# Patient Record
Sex: Female | Born: 1965
Health system: Southern US, Community
[De-identification: ages and names within clinical notes are randomized; demographics above are authoritative.]

## PROBLEM LIST (undated history)

## (undated) DIAGNOSIS — G43909 Migraine, unspecified, not intractable, without status migrainosus: Secondary | ICD-10-CM

## (undated) DIAGNOSIS — E559 Vitamin D deficiency, unspecified: Secondary | ICD-10-CM

## (undated) DIAGNOSIS — T7840XA Allergy, unspecified, initial encounter: Secondary | ICD-10-CM

## (undated) HISTORY — PX: BREAST SURGERY: SHX581

## (undated) HISTORY — DX: Allergy, unspecified, initial encounter: T78.40XA

## (undated) HISTORY — PX: DILATION AND CURETTAGE OF UTERUS: SHX78

## (undated) HISTORY — PX: COLONOSCOPY: SHX174

## (undated) HISTORY — PX: VARICOSE VEIN SURGERY: SHX832

## (undated) HISTORY — PX: WISDOM TOOTH EXTRACTION: SHX21

---

## 1998-04-06 ENCOUNTER — Other Ambulatory Visit: Admission: RE | Admit: 1998-04-06 | Discharge: 1998-04-06 | Payer: Self-pay | Admitting: Obstetrics and Gynecology

## 1999-03-26 ENCOUNTER — Other Ambulatory Visit: Admission: RE | Admit: 1999-03-26 | Discharge: 1999-03-26 | Payer: Self-pay | Admitting: Obstetrics and Gynecology

## 1999-04-14 ENCOUNTER — Inpatient Hospital Stay (HOSPITAL_COMMUNITY): Admission: AD | Admit: 1999-04-14 | Discharge: 1999-04-14 | Payer: Self-pay | Admitting: Obstetrics and Gynecology

## 1999-10-22 ENCOUNTER — Encounter (INDEPENDENT_AMBULATORY_CARE_PROVIDER_SITE_OTHER): Payer: Self-pay

## 1999-10-22 ENCOUNTER — Inpatient Hospital Stay (HOSPITAL_COMMUNITY): Admission: AD | Admit: 1999-10-22 | Discharge: 1999-10-26 | Payer: Self-pay | Admitting: Obstetrics and Gynecology

## 1999-10-27 ENCOUNTER — Encounter: Admission: RE | Admit: 1999-10-27 | Discharge: 2000-01-25 | Payer: Self-pay | Admitting: Obstetrics and Gynecology

## 1999-12-03 ENCOUNTER — Other Ambulatory Visit: Admission: RE | Admit: 1999-12-03 | Discharge: 1999-12-03 | Payer: Self-pay | Admitting: Obstetrics and Gynecology

## 2000-01-27 ENCOUNTER — Encounter: Admission: RE | Admit: 2000-01-27 | Discharge: 2000-02-15 | Payer: Self-pay | Admitting: Obstetrics and Gynecology

## 2000-12-04 ENCOUNTER — Other Ambulatory Visit: Admission: RE | Admit: 2000-12-04 | Discharge: 2000-12-04 | Payer: Self-pay | Admitting: Obstetrics and Gynecology

## 2001-12-17 ENCOUNTER — Other Ambulatory Visit: Admission: RE | Admit: 2001-12-17 | Discharge: 2001-12-17 | Payer: Self-pay | Admitting: Obstetrics and Gynecology

## 2007-07-01 ENCOUNTER — Encounter: Admission: RE | Admit: 2007-07-01 | Discharge: 2007-07-01 | Payer: Self-pay | Admitting: Obstetrics and Gynecology

## 2007-07-01 IMAGING — US US EXTREM LOW VENOUS BILAT
1 series · 13 of 24 positions shown · non-contrast
Comparison: none

CLINICAL DATA: Varicose veins of right lower extremity.  The patient is being evaluated for possible varicose vein treatment. 
RIGHT LOWER EXTREMITY VENOUS DOPPLER ULTRASOUND:
TECHNIQUE: A full lower extremity venous evaluation was performed with evaluation of the deep veins in a supine position followed by an upright superficial venous insufficiency exam.

[Series 1: us extrem low venous bilat · 13 of 35 slices shown]
[im 1/35]
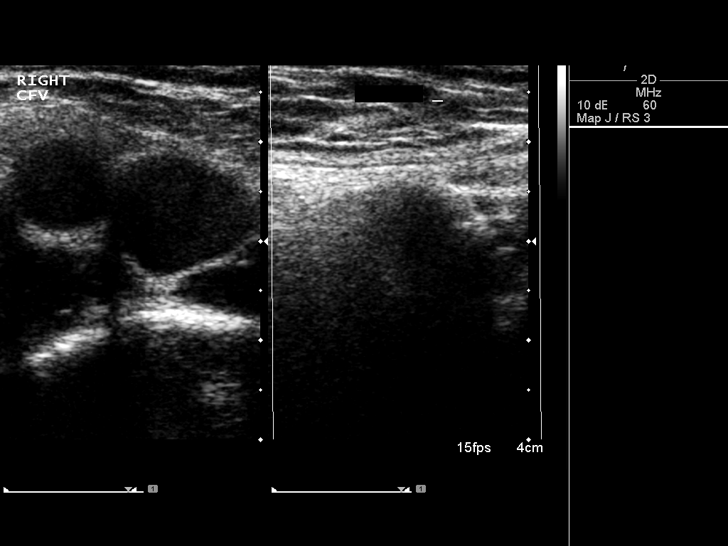
[im 3/35]
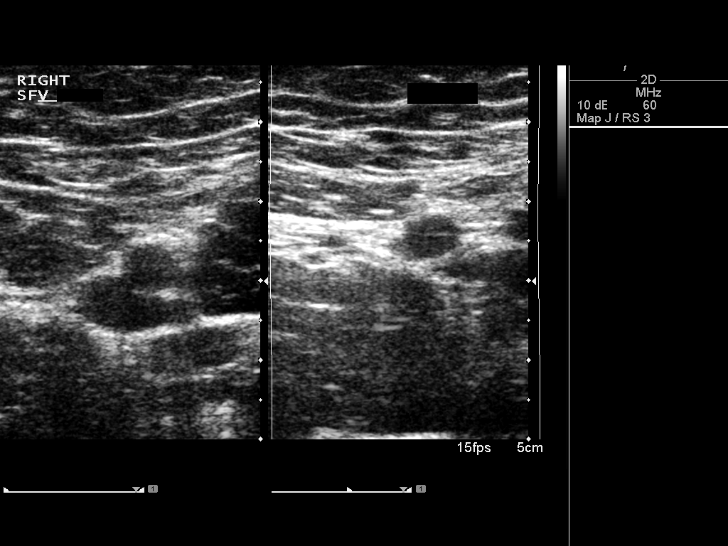
[im 6/35]
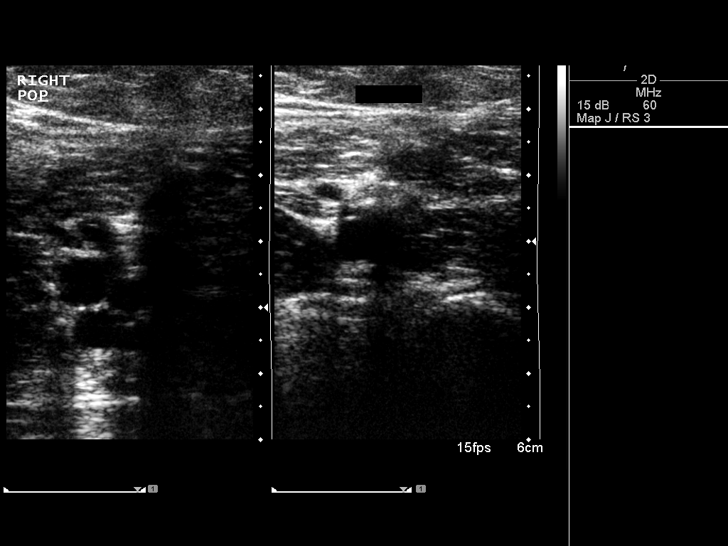
[im 9/35]
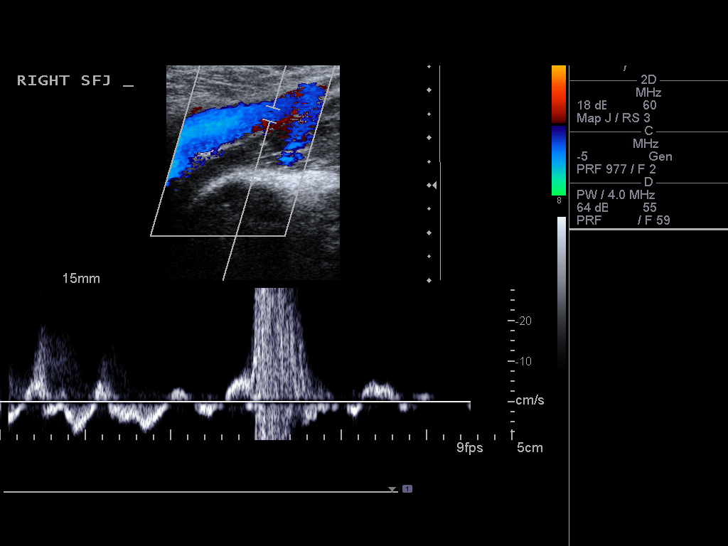
[im 12/35]
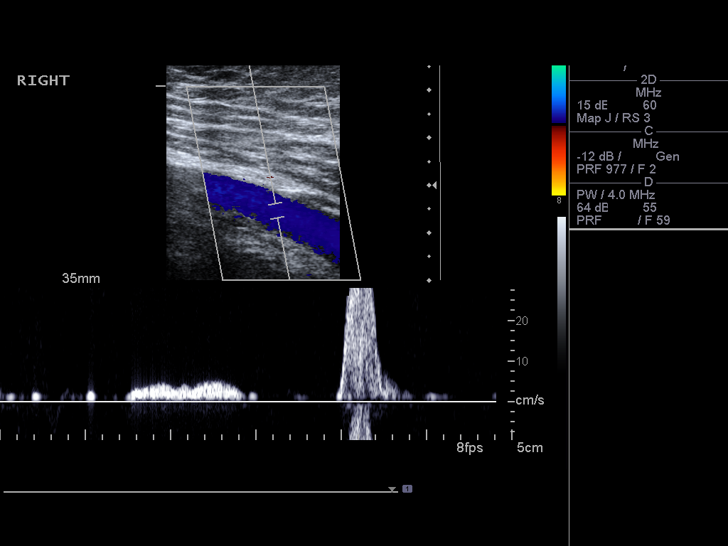
[im 15/35]
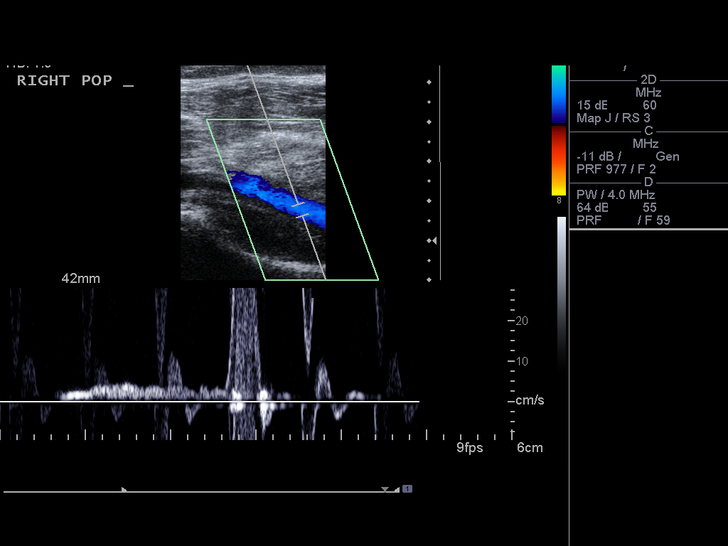
[im 18/35]
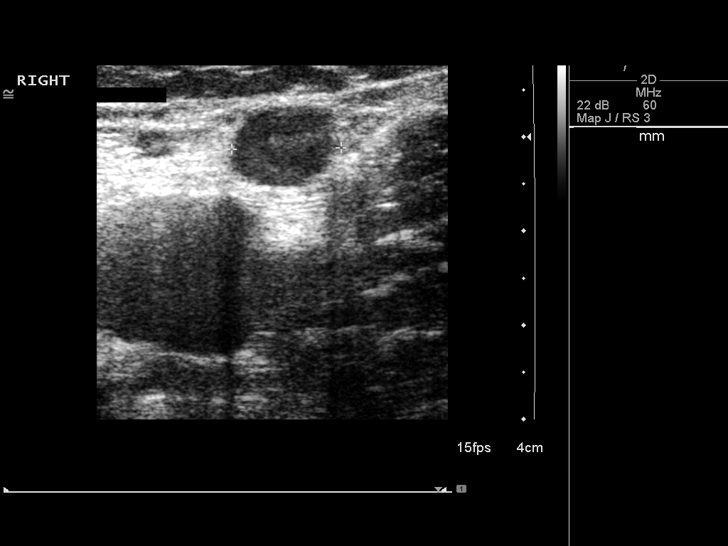
[im 20/35]
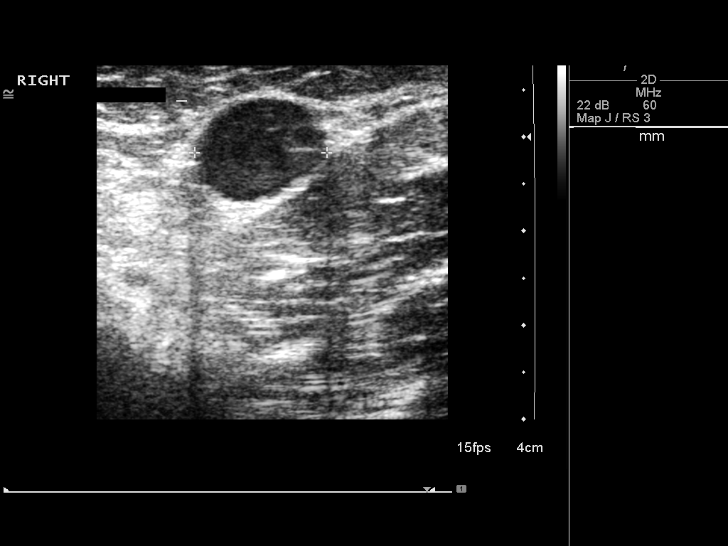
[im 23/35]
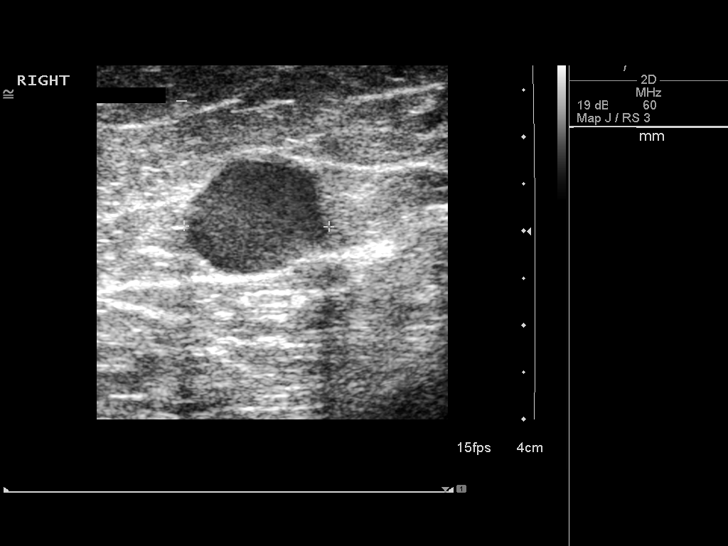
[im 26/35]
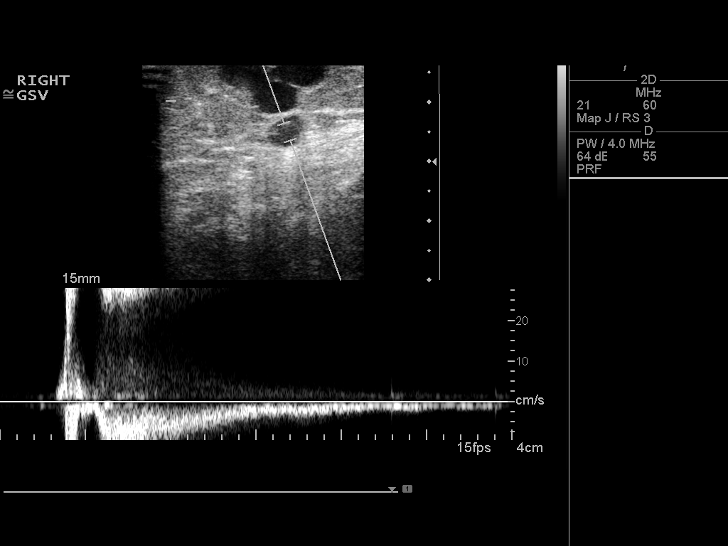
[im 29/35]
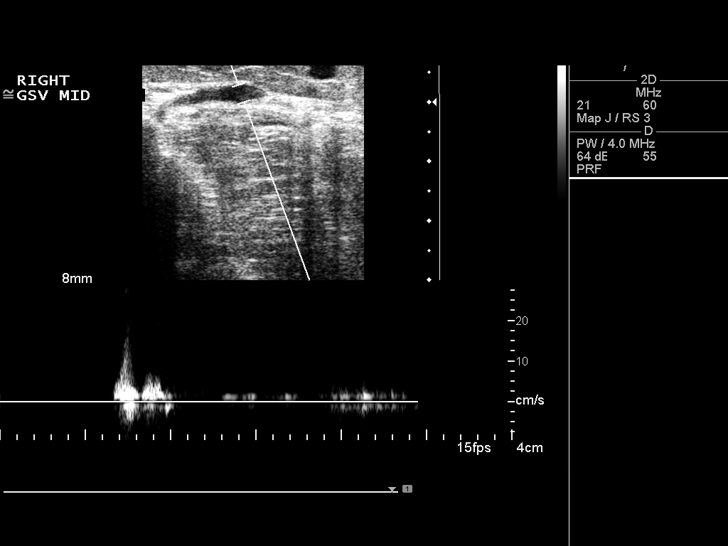
[im 32/35]
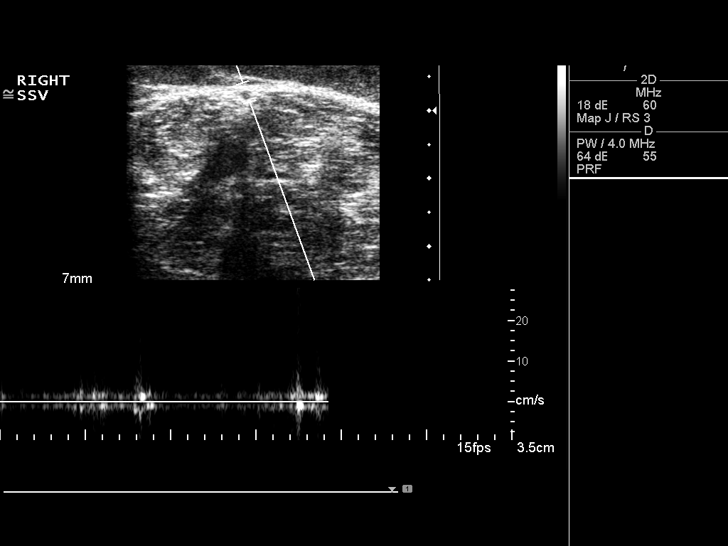
[im 35/35]
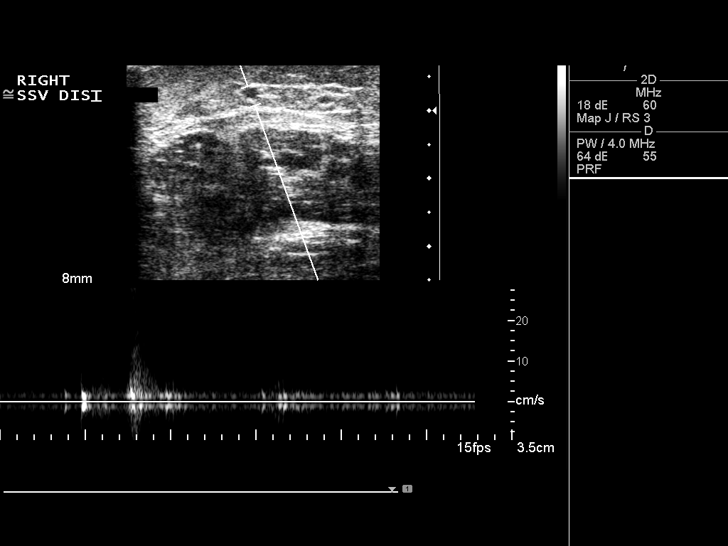

[13 of 24 positions shown; findings below may reference images not displayed]

FINDINGS: Deep venous evaluation shows a normal deep venous system in the right lower extremity with no evidence of thrombosis, deep venous reflux or stigmata of prior thrombosis.  All of the deep veins show normal compressibility, augmentation and phasicity.  
Upright insufficiency exam demonstrates diffuse and severe venous incompetence and insufficiency of nearly the entire right great saphenous vein.  Reflux is demonstrated over a prolonged time with augmentation beginning at the saphenofemoral junction and continuing throughout the thigh.  There are segments of aneurysmal dilatation of the great saphenous vein in the thigh.  In the proximal thigh, dilated segments of the vein just below the saphenofemoral junction measured between 11 and 14 mm in diameter.  In the distal thigh, a focal aneurysmal segment measures 15 mm in diameter.  
A large varicose truck communication is demonstrated off of the posteromedial aspect of the great saphenous vein at the knee.  This communicates with a network of dilated varicosities of the proximal calf.  Reflux is demonstrated below the varicose trunk to roughly the level of the mid calf.  A perforator is demonstrated in the mid calf from the great saphenous vein to the deep venous system.  No venous insufficiency is demonstrated in the distal calf.  
Short saphenous evaluation shows a tiny caliber short saphenous vein in the distal thigh and knee.  The vein does communicate with varicosities in the mid calf along the medial aspect of the calf.  The short saphenous vein does not demonstrate significant reflux or insufficiency.
IMPRESSION: Significant venous insufficiency of the right great saphenous vein with demonstrated communication to a network of dilated varicosities of the proximal calf.  Prolonged reflux is seen throughout the course of the vein extending from the saphenofemoral junction to roughly the mid calf.  The very distal calf segment does not show significant reflux.   Aneurysmal segments of the great saphenous vein are present in the thigh.  A mid calf perforator is also present.   The short saphenous vein does communicate via one trunk in the mid calf with calf varicosities.  However, no significant venous insufficiency is detected in the short saphenous vein.  The deep venous system of the right lower extremity is normal.

## 2010-12-21 NOTE — Discharge Summary (Signed)
[facility]  Patient:    Yvonne Morales, Yvonne Morales                     MRN: [id number] Adm. Date:  [id number] Disc. Date: [id number] Attending:  Minette Headland Dictator:   Danie Chandler, R.N.                           Discharge Summary  ADMISSION DIAGNOSES:          1. Intrauterine pregnancy at term.                               2. For induction of labor.  DISCHARGE DIAGNOSES:          1. Intrauterine pregnancy at term.                               2. For induction of labor.                               3. Arrest of cervical dilation.                               4. Failed vaginal birth after cesarean section.  PROCEDURE:                    On October 23, 1999, repeat low transverse cesarean  section.  REASON FOR ADMISSION:         The patient is a 45 year old married black female, gx 3, para 1, with an estimated date of confinement of October 23, 1999.  Her prenatal care has been complicated by history of cesarean section for CPD and advanced maternal age for which she underwent an amniocentesis which was consistent with  normal 6 XX chromosomes.  The patient was admitted for two-stage induction of labor for attempted VBAC on the evening of October 22, 1999.  Cervidil was placed. On the morning of October 23, 1999, this was removed and Pitocin was started. That evening, the cervix was 2 to 3 cm dilated, 90% effaced, and -1 station. Artificial rupture of membranes was carried out revealing thin meconium.  An IUPC was placed and amnioinfusion was begun.  The patient progressed to 4 cm dilation.  The cervix then began puffing up and did not dilate further after approximately an hour and 20 minutes.  The diagnosis of arrest of cervical dilation was made and recommendation for cesarean section was made.  HOSPITAL COURSE:              The patient is taken to the operating room and undergoes the above named procedure without complications.  This is  productive f a viable female infant with Apgars of 8 at one minute and 9 at five minutes.  An arterial cord pH of 7.33.  Postoperatively, the patient does well.  On postoperative day #1, the patient had good control of pain.  Her hemoglobin was  10.9, hematocrit 32.5, and white blood cell count 12.0.  On postoperative day #2, the patient had a good return of bowel function and was ambulating well without  difficulty.  On postoperative day #3, she requested discharge home and she was tolerating a regular  diet.  CONDITION ON DISCHARGE:       Good.  DIET:                         Regular as tolerated.  ACTIVITY:                     No heavy lifting, no driving, and no vaginal entry.  FOLLOW-UP:                    She is to follow up in the office in one to two weeks for incision check and she is to call for a temperature greater than 100 degrees, persistent nausea and vomiting, heavy vaginal bleeding, and/or redness or drainage from the incision site.  DISCHARGE MEDICATIONS:        1. Prenatal vitamins one p.o. q.d.                               2. Pain medicines as directed by M.D. DD:  10/26/99 TD:  10/26/99 Job: [id number]

## 2010-12-21 NOTE — Op Note (Signed)
[facility]  Patient:    Yvonne Morales, Yvonne Morales                     MRN: [id number] Proc. Date: 10/23/99 Adm. Date:  [id number] Attending:  Minette Headland                           Operative Report  PREOPERATIVE DIAGNOSIS:       Intrauterine pregnancy at 40-0/7 weeks.  Arrest of cervical dilation.  Failed VBAC.  POSTOPERATIVE DIAGNOSIS:      Intrauterine pregnancy at 40-0/7 weeks.  Arrest of cervical dilation.  Failed VBAC.  OPERATION:                    Repeat low transverse cesarean section.  SURGEON:                      Guy Sandifer. Arleta Creek, M.D.  ASSISTANT:  ANESTHESIA:                   Epidural.  ESTIMATED BLOOD LOSS:         800 cc.  FINDINGS:                     A viable female infant with Apgars of 8 and 9 at ne and five minutes, respectively.  Birth weight pending.  Arterial cord pH 7.33.  INDICATIONS:                  This patient is a 45 year old married black female, gravida 3, para 1, abortus 1, EDC of October 23, 1999, with prenatal care complicated by history of cesarean section with an 8 pound 9 ounce baby for CPD.  Advanced maternal age was also noted with amniocentesis consistent with normal 46XX chromosomes.  After careful discussion of the risks of VBAC, the patient was admitted for a two-stage induction of labor for attempted VBAC on the evening of October 22, 1999.  Cervidil was placed in the vagina.  The a.m. of October 23, 1999, this was removed and Pitocin was started.  At 6:40 p.m. the cervix was 2 to 3 cm dilated, 90% effaced, and -1 station.  Artificial rupture of membranes had previously been carried out for thin meconium.  IUPC was placed and amnioinfusion was started.  With an excellent contraction pattern, the patient progressed to cm dilation.  The cervix then began puffing up and did not dilate further after approximately an hour and 20 minutes.  The diagnosis of arrest of cervical dilation was made and  recommendation for cesarean section was made.  Possible risks and complications were discussed and reviewed with the patient and her husband and ll questions were answered.  DESCRIPTION OF PROCEDURE:     The patient was taken to the operating room and placed in the dorsal supine position with a 15 degree left lateral wedge. Foley catheter is already in place and the epidural anesthetic is augmented to a surgical level.  The patient is then prepped and draped in a sterile fashion.  After testing for adequate epidural anesthesia, the skin is entered through a Pfannenstiel incision and dissection is carried out in layers to the peritoneum.  The peritoneum is sharply entered and extended superiorly and inferiorly.  The vesicouterine peritoneum is taken down cephalolaterally.  The bladder flap is developed and the bladder blade is placed.  The uterus is  incised in a low transverse manner and he uterine cavity is entered bluntly with a hemostat.  The incision is extended cephalolaterally with the fingers.  The vertex was then delivered and DeLee suction of the oral and nasopharynx is carried out.  The remainder of the infant is then delivered.  Good cry and tone is noted.  Cord is clamped and cut and the infant is handed to the awaiting pediatrics team.  The placenta is manually delivered and  sent to pathology for examination.  The uterus is normal on internal contour. he uterus is closed in a running locking fashion with 0 Monocryl suture.  Good hemostasis of the incision is noted.  Numerous small capillary bleeders are cauterized on the bladder flap.  Inspection of the ovaries is normal bilaterally. There are numerous 1 cm subserosal fibroids on the anterior superior uterine fundus.  Anterior peritoneum is closed in a running fashion with 0 Monocryl suture which is also used to reapproximate the pyramidalis muscle in the midline. Numerous bleeders are then noted on the rectus  muscle which are also cauterized. Good hemostasis is obtained.  The fascia is closed in a running fashion with 0 DS suture.  Again numerous bleeders are noted in the subcutaneous tissues as well.  These were all controlled with cautery.  The skin is closed with clips. Sponge, needle, and instrument counts were correct.  The patient is transferred to the recovery room in stable condition. DD:  10/23/99 TD:  10/24/99 Job: [id number]

## 2012-08-14 ENCOUNTER — Encounter (HOSPITAL_BASED_OUTPATIENT_CLINIC_OR_DEPARTMENT_OTHER): Payer: Self-pay | Admitting: *Deleted

## 2012-08-14 ENCOUNTER — Emergency Department (HOSPITAL_BASED_OUTPATIENT_CLINIC_OR_DEPARTMENT_OTHER)
Admission: EM | Admit: 2012-08-14 | Discharge: 2012-08-14 | Disposition: A | Discharge status: Home / Self Care | Payer: BC Managed Care – PPO | Attending: Emergency Medicine | Admitting: Emergency Medicine

## 2012-08-14 ENCOUNTER — Emergency Department (HOSPITAL_BASED_OUTPATIENT_CLINIC_OR_DEPARTMENT_OTHER): Payer: BC Managed Care – PPO

## 2012-08-14 DIAGNOSIS — R5381 Other malaise: Secondary | ICD-10-CM | POA: Insufficient documentation

## 2012-08-14 DIAGNOSIS — R059 Cough, unspecified: Secondary | ICD-10-CM | POA: Insufficient documentation

## 2012-08-14 DIAGNOSIS — R079 Chest pain, unspecified: Secondary | ICD-10-CM | POA: Insufficient documentation

## 2012-08-14 DIAGNOSIS — R109 Unspecified abdominal pain: Secondary | ICD-10-CM | POA: Insufficient documentation

## 2012-08-14 DIAGNOSIS — R05 Cough: Secondary | ICD-10-CM | POA: Insufficient documentation

## 2012-08-14 DIAGNOSIS — J029 Acute pharyngitis, unspecified: Secondary | ICD-10-CM | POA: Insufficient documentation

## 2012-08-14 DIAGNOSIS — J069 Acute upper respiratory infection, unspecified: Secondary | ICD-10-CM | POA: Insufficient documentation

## 2012-08-14 DIAGNOSIS — J3489 Other specified disorders of nose and nasal sinuses: Secondary | ICD-10-CM | POA: Insufficient documentation

## 2012-08-14 DIAGNOSIS — IMO0001 Reserved for inherently not codable concepts without codable children: Secondary | ICD-10-CM | POA: Insufficient documentation

## 2012-08-14 DIAGNOSIS — R110 Nausea: Secondary | ICD-10-CM | POA: Insufficient documentation

## 2012-08-14 DIAGNOSIS — R51 Headache: Secondary | ICD-10-CM | POA: Insufficient documentation

## 2012-08-14 DIAGNOSIS — J111 Influenza due to unidentified influenza virus with other respiratory manifestations: Secondary | ICD-10-CM | POA: Insufficient documentation

## 2012-08-14 DIAGNOSIS — R5383 Other fatigue: Secondary | ICD-10-CM | POA: Insufficient documentation

## 2012-08-14 MED ORDER — ACETAMINOPHEN 325 MG PO TABS
650.0000 mg | ORAL_TABLET | Freq: Once | ORAL | Status: AC
  Administered 2012-08-14: 650 mg via ORAL
  Filled 2012-08-14: qty 2

## 2012-08-14 MED ORDER — HYDROCODONE-HOMATROPINE 5-1.5 MG/5ML PO SYRP: 5.0000 mL | ORAL_SOLUTION | Freq: Four times a day (QID) | ORAL | Status: DC | PRN

## 2012-08-14 NOTE — ED Provider Notes (Addendum)
History     CSN: [id number]  Arrival date & time 08/14/12  7829   First MD Initiated Contact with Patient 08/14/12 1022      Chief Complaint  Patient presents with  . Fever  . Cough    (Consider location/radiation/quality/duration/timing/severity/associated sxs/prior treatment) Patient is a 47 y.o. female presenting with flu symptoms and URI. The history is provided by the patient.  Influenza This is a new problem. The current episode started yesterday. The problem occurs constantly. The problem has been gradually worsening. Associated symptoms include chest pain, abdominal pain and headaches. Pertinent negatives include no shortness of breath. Nothing aggravates the symptoms. Nothing relieves the symptoms. Treatments tried: nyquil. The treatment provided mild relief.  URI The primary symptoms include fever, fatigue, headaches, sore throat, cough, abdominal pain, nausea and myalgias. Primary symptoms do not include wheezing or vomiting. The current episode started yesterday. This is a new problem. The problem has been gradually worsening.  The onset of the illness is associated with exposure to sick contacts. Symptoms associated with the illness include congestion.    History reviewed. No pertinent past medical history.  History reviewed. No pertinent past surgical history.  History reviewed. No pertinent family history.  History  Substance Use Topics  . Smoking status: Never Smoker   . Smokeless tobacco: Not on file  . Alcohol Use: No    OB History    Grav Para Term Preterm Abortions TAB SAB Ect Mult Living                  Review of Systems  Constitutional: Positive for fever and fatigue.  HENT: Positive for congestion and sore throat.   Respiratory: Positive for cough. Negative for shortness of breath and wheezing.   Cardiovascular: Positive for chest pain.  Gastrointestinal: Positive for nausea and abdominal pain. Negative for vomiting.  Musculoskeletal: Positive  for myalgias.  Neurological: Positive for headaches.  All other systems reviewed and are negative.    Allergies  Latex  Home Medications  No current outpatient prescriptions on file.  BP 111/58  Pulse 120  Temp 100.1 F (37.8 C) (Oral)  Resp 20  Ht 5\' 4"  (1.626 m)  Wt 160 lb (72.576 kg)  BMI 27.46 kg/m2  SpO2 98%  LMP 08/01/2012  Physical Exam  Nursing note and vitals reviewed. Constitutional: She is oriented to person, place, and time. She appears well-developed and well-nourished. She appears distressed.  HENT:  Head: Normocephalic and atraumatic.  Eyes: EOM are normal. Pupils are equal, round, and reactive to light.  Cardiovascular: Regular rhythm, normal heart sounds and intact distal pulses.  Tachycardia present.  Exam reveals no friction rub.   No murmur heard. Pulmonary/Chest: Effort normal and breath sounds normal. She has no wheezes. She has no rales. She exhibits tenderness.  Abdominal: Soft. Bowel sounds are normal. She exhibits no distension. There is no tenderness. There is no rebound and no guarding.       No focal abd pain, just diffuse pain all over the body  Musculoskeletal: Normal range of motion. She exhibits no tenderness.       No edema  Neurological: She is alert and oriented to person, place, and time. No cranial nerve deficit.  Skin: Skin is warm and dry. No rash noted.  Psychiatric: She has a normal mood and affect. Her behavior is normal.    ED Course  Procedures (including critical care time)  Labs Reviewed - No data to display No results found.   1.  Influenza       MDM   Pt with symptoms consistent with influenza.  Normal exam here but is febrile.  No signs of breathing difficulty  No signs of strep pharyngitis, otitis or abnormal abdominal findings.   Will continue antipyretica and rest and fluids and return for any further problems.         Gwyneth Sprout, MD 08/14/12 1034  Gwyneth Sprout, MD 08/14/12 1034

## 2012-08-14 NOTE — ED Notes (Signed)
2 days of cough fever body aches and chills  Denies n/v/d

## 2014-02-10 ENCOUNTER — Emergency Department (HOSPITAL_COMMUNITY)
Admission: EM | Admit: 2014-02-10 | Discharge: 2014-02-10 | Disposition: A | Discharge status: Home / Self Care | Payer: BC Managed Care – PPO | Attending: Emergency Medicine | Admitting: Emergency Medicine

## 2014-02-10 ENCOUNTER — Encounter (HOSPITAL_COMMUNITY): Payer: Self-pay | Admitting: Emergency Medicine

## 2014-02-10 DIAGNOSIS — Z3202 Encounter for pregnancy test, result negative: Secondary | ICD-10-CM | POA: Diagnosis not present

## 2014-02-10 DIAGNOSIS — R51 Headache: Secondary | ICD-10-CM | POA: Diagnosis present

## 2014-02-10 DIAGNOSIS — Z91040 Latex allergy status: Secondary | ICD-10-CM | POA: Insufficient documentation

## 2014-02-10 DIAGNOSIS — G43909 Migraine, unspecified, not intractable, without status migrainosus: Secondary | ICD-10-CM | POA: Diagnosis not present

## 2014-02-10 DIAGNOSIS — G43009 Migraine without aura, not intractable, without status migrainosus: Secondary | ICD-10-CM

## 2014-02-10 HISTORY — DX: Migraine, unspecified, not intractable, without status migrainosus: G43.909

## 2014-02-10 LAB — CBC WITH DIFFERENTIAL/PLATELET
BASOS PCT: 1 % (ref 0–1)
Basophils Absolute: 0 10*3/uL (ref 0.0–0.1)
EOS ABS: 0.1 10*3/uL (ref 0.0–0.7)
EOS PCT: 1 % (ref 0–5)
HEMATOCRIT: 37.3 % (ref 36.0–46.0)
Hemoglobin: 12.7 g/dL (ref 12.0–15.0)
Lymphocytes Relative: 27 % (ref 12–46)
Lymphs Abs: 1.4 10*3/uL (ref 0.7–4.0)
MCH: 29 pg (ref 26.0–34.0)
MCHC: 34 g/dL (ref 30.0–36.0)
MCV: 85.2 fL (ref 78.0–100.0)
MONOS PCT: 7 % (ref 3–12)
Monocytes Absolute: 0.4 10*3/uL (ref 0.1–1.0)
NEUTROS ABS: 3.3 10*3/uL (ref 1.7–7.7)
NEUTROS PCT: 64 % (ref 43–77)
Platelets: 225 10*3/uL (ref 150–400)
RBC: 4.38 MIL/uL (ref 3.87–5.11)
RDW: 13.8 % (ref 11.5–15.5)
WBC: 5.2 10*3/uL (ref 4.0–10.5)

## 2014-02-10 LAB — URINALYSIS, ROUTINE W REFLEX MICROSCOPIC
BILIRUBIN URINE: NEGATIVE
GLUCOSE, UA: NEGATIVE mg/dL
HGB URINE DIPSTICK: NEGATIVE
KETONES UR: 15 mg/dL — AB
LEUKOCYTES UA: NEGATIVE
Nitrite: NEGATIVE
PH: 8.5 — AB (ref 5.0–8.0)
PROTEIN: 30 mg/dL — AB
SPECIFIC GRAVITY, URINE: 1.022 (ref 1.005–1.030)
UROBILINOGEN UA: 0.2 mg/dL (ref 0.0–1.0)

## 2014-02-10 LAB — URINE MICROSCOPIC-ADD ON

## 2014-02-10 LAB — COMPREHENSIVE METABOLIC PANEL
ALK PHOS: 40 U/L (ref 39–117)
ALT: 19 U/L (ref 0–35)
ANION GAP: 13 (ref 5–15)
AST: 22 U/L (ref 0–37)
Albumin: 3.6 g/dL (ref 3.5–5.2)
BUN: 15 mg/dL (ref 6–23)
CO2: 24 mEq/L (ref 19–32)
Calcium: 9.2 mg/dL (ref 8.4–10.5)
Chloride: 100 mEq/L (ref 96–112)
Creatinine, Ser: 0.98 mg/dL (ref 0.50–1.10)
GFR calc Af Amer: 77 mL/min — ABNORMAL LOW (ref >90)
GFR calc non Af Amer: 67 mL/min — ABNORMAL LOW (ref >90)
GLUCOSE: 114 mg/dL — AB (ref 70–99)
Potassium: 3.5 mEq/L — ABNORMAL LOW (ref 3.7–5.3)
SODIUM: 137 meq/L (ref 137–147)
Total Bilirubin: 0.2 mg/dL — ABNORMAL LOW (ref 0.3–1.2)
Total Protein: 7.3 g/dL (ref 6.0–8.3)

## 2014-02-10 LAB — LIPASE, BLOOD: Lipase: 13 U/L (ref 11–59)

## 2014-02-10 LAB — POC URINE PREG, ED: Preg Test, Ur: NEGATIVE

## 2014-02-10 MED ORDER — SODIUM CHLORIDE 0.9 % IV BOLUS (SEPSIS)
1000.0000 mL | Freq: Once | INTRAVENOUS | Status: AC
  Administered 2014-02-10: 1000 mL via INTRAVENOUS

## 2014-02-10 MED ORDER — CEPHALEXIN 500 MG PO CAPS: 500.0000 mg | ORAL_CAPSULE | Freq: Four times a day (QID) | ORAL | Status: DC

## 2014-02-10 MED ORDER — FLUCONAZOLE 150 MG PO TABS: 150.0000 mg | ORAL_TABLET | Freq: Once | ORAL | Status: DC

## 2014-02-10 MED ORDER — METOCLOPRAMIDE HCL 5 MG/ML IJ SOLN
10.0000 mg | Freq: Once | INTRAMUSCULAR | Status: AC
  Administered 2014-02-10: 10 mg via INTRAVENOUS
  Filled 2014-02-10: qty 2

## 2014-02-10 MED ORDER — DEXAMETHASONE SODIUM PHOSPHATE 10 MG/ML IJ SOLN
10.0000 mg | Freq: Once | INTRAMUSCULAR | Status: AC
  Administered 2014-02-10: 10 mg via INTRAVENOUS
  Filled 2014-02-10: qty 1

## 2014-02-10 MED ORDER — DIPHENHYDRAMINE HCL 50 MG/ML IJ SOLN
25.0000 mg | Freq: Once | INTRAMUSCULAR | Status: AC
  Administered 2014-02-10: 25 mg via INTRAVENOUS
  Filled 2014-02-10: qty 1

## 2014-02-10 NOTE — ED Provider Notes (Signed)
Medical screening examination/treatment/procedure(s) were performed by non-physician practitioner and as supervising physician I was immediately available for consultation/collaboration.   EKG Interpretation None        Houston Siren III, MD 02/10/14 2220

## 2014-02-10 NOTE — ED Provider Notes (Signed)
CSN: [id number]     Arrival date & time 02/10/14  1954 History   First MD Initiated Contact with Patient 02/10/14 2000     Chief Complaint  Patient presents with  . Migraine     (Consider location/radiation/quality/duration/timing/severity/associated sxs/prior Treatment) HPI Comments: Patient presents to the emergency department with chief complaint of migraine. She states that the headache started around 4:00 this afternoon. She has a history of migraines. She states the pain is progressively worsened. She reports associated nausea and vomiting. She also reports chills, but denies any fevers. She states that she has some crampy abdominal pain, which she believes is from the vomiting. She is tried taking some Tylenol with minimal relief. She has not taken anything for her migraines. She is currently being evaluated for renal disease by her primary care provider. She denies any chest pain or shortness of breath.  The history is provided by the patient. No language interpreter was used.    Past Medical History  Diagnosis Date  . Migraine    History reviewed. No pertinent past surgical history. No family history on file. History  Substance Use Topics  . Smoking status: Never Smoker   . Smokeless tobacco: Never Used  . Alcohol Use: No   OB History   Grav Para Term Preterm Abortions TAB SAB Ect Mult Living                 Review of Systems  All other systems reviewed and are negative.     Allergies  Latex  Home Medications   Prior to Admission medications   Medication Sig Start Date End Date Taking? Authorizing Provider  HYDROcodone-homatropine (HYCODAN) 5-1.5 MG/5ML syrup Take 5 mLs by mouth every 6 (six) hours as needed for cough. 08/14/12   Blanchie Dessert, MD   BP 118/76  Pulse 64  Temp(Src) 97.6 F (36.4 C) (Oral)  Resp 14  SpO2 100%  LMP 02/01/2014 Physical Exam  Nursing note and vitals reviewed. Constitutional: She is oriented to person, place, and time. She  appears well-developed and well-nourished.  HENT:  Head: Normocephalic and atraumatic.  Right Ear: External ear normal.  Left Ear: External ear normal.  Eyes: Conjunctivae and EOM are normal. Pupils are equal, round, and reactive to light.  Neck: Normal range of motion. Neck supple.  No pain with neck flexion, no meningismus  Cardiovascular: Normal rate, regular rhythm and normal heart sounds.  Exam reveals no gallop and no friction rub.   No murmur heard. Pulmonary/Chest: Effort normal and breath sounds normal. No respiratory distress. She has no wheezes. She has no rales. She exhibits no tenderness.  Abdominal: Soft. She exhibits no distension and no mass. There is no tenderness. There is no rebound and no guarding.  Diffuse upper abdominal tenderness, no focal lower abdominal tenderness, no pain at McBurney's point  Musculoskeletal: Normal range of motion. She exhibits no edema and no tenderness.  Normal gait.  Neurological: She is alert and oriented to person, place, and time. She has normal reflexes.  CN 3-12 intact, normal finger to nose, no pronator drift, sensation and strength intact bilaterally.  Skin: Skin is warm and dry.  Psychiatric: She has a normal mood and affect. Her behavior is normal. Judgment and thought content normal.    ED Course  Procedures (including critical care time) Results for orders placed during the hospital encounter of 02/10/14  CBC WITH DIFFERENTIAL      Result Value Ref Range   WBC 5.2  4.0 -  10.5 K/uL   RBC 4.38  3.87 - 5.11 MIL/uL   Hemoglobin 12.7  12.0 - 15.0 g/dL   HCT 37.3  36.0 - 46.0 %   MCV 85.2  78.0 - 100.0 fL   MCH 29.0  26.0 - 34.0 pg   MCHC 34.0  30.0 - 36.0 g/dL   RDW 13.8  11.5 - 15.5 %   Platelets 225  150 - 400 K/uL   Neutrophils Relative % 64  43 - 77 %   Neutro Abs 3.3  1.7 - 7.7 K/uL   Lymphocytes Relative 27  12 - 46 %   Lymphs Abs 1.4  0.7 - 4.0 K/uL   Monocytes Relative 7  3 - 12 %   Monocytes Absolute 0.4  0.1 - 1.0  K/uL   Eosinophils Relative 1  0 - 5 %   Eosinophils Absolute 0.1  0.0 - 0.7 K/uL   Basophils Relative 1  0 - 1 %   Basophils Absolute 0.0  0.0 - 0.1 K/uL  URINALYSIS, ROUTINE W REFLEX MICROSCOPIC      Result Value Ref Range   Color, Urine YELLOW  YELLOW   APPearance CLEAR  CLEAR   Specific Gravity, Urine 1.022  1.005 - 1.030   pH 8.5 (*) 5.0 - 8.0   Glucose, UA NEGATIVE  NEGATIVE mg/dL   Hgb urine dipstick NEGATIVE  NEGATIVE   Bilirubin Urine NEGATIVE  NEGATIVE   Ketones, ur 15 (*) NEGATIVE mg/dL   Protein, ur 30 (*) NEGATIVE mg/dL   Urobilinogen, UA 0.2  0.0 - 1.0 mg/dL   Nitrite NEGATIVE  NEGATIVE   Leukocytes, UA NEGATIVE  NEGATIVE  COMPREHENSIVE METABOLIC PANEL      Result Value Ref Range   Sodium 137  137 - 147 mEq/L   Potassium 3.5 (*) 3.7 - 5.3 mEq/L   Chloride 100  96 - 112 mEq/L   CO2 24  19 - 32 mEq/L   Glucose, Bld 114 (*) 70 - 99 mg/dL   BUN 15  6 - 23 mg/dL   Creatinine, Ser 0.98  0.50 - 1.10 mg/dL   Calcium 9.2  8.4 - 10.5 mg/dL   Total Protein 7.3  6.0 - 8.3 g/dL   Albumin 3.6  3.5 - 5.2 g/dL   AST 22  0 - 37 U/L   ALT 19  0 - 35 U/L   Alkaline Phosphatase 40  39 - 117 U/L   Total Bilirubin 0.2 (*) 0.3 - 1.2 mg/dL   GFR calc non Af Amer 67 (*) >90 mL/min   GFR calc Af Amer 77 (*) >90 mL/min   Anion gap 13  5 - 15  LIPASE, BLOOD      Result Value Ref Range   Lipase 13  11 - 59 U/L  URINE MICROSCOPIC-ADD ON      Result Value Ref Range   Squamous Epithelial / LPF FEW (*) RARE   WBC, UA 7-10  <3 WBC/hpf   RBC / HPF 0-2  <3 RBC/hpf   Bacteria, UA RARE  RARE   Urine-Other MUCOUS PRESENT    POC URINE PREG, ED      Result Value Ref Range   Preg Test, Ur NEGATIVE  NEGATIVE   No results found.    EKG Interpretation None      MDM   Final diagnoses:  Nonintractable migraine, unspecified migraine type   Patient with headache, likely typical migraine. Will treat the patient's symptoms, and check labs, she has had  severe vomiting today as well. 9:56  PM  Pt HA treated and improved while in ED.  Presentation is like pts typical HA and non concerning for [facility], ICH, Meningitis, or temporal arteritis. Pt is afebrile with no focal neuro deficits, nuchal rigidity, or change in vision. Repeat abdominal exam is reassuring.  Labs are reassuring.  Patient instructed to return for:  New or worsening symptoms, including, increased abdominal pain, especially pain that localizes to one side, bloody vomit, bloody diarrhea, fever >101, and intractable vomiting.  Pt is to follow up with PCP to discuss prophylactic medication. Pt verbalizes understanding and is agreeable with plan to dc.      Montine Circle, PA-C 02/10/14 2156

## 2014-02-10 NOTE — ED Notes (Signed)
Per EMS pt from home, complaining of migraine started about 1745, took tylenol at home w/ mild relief, hx of migraines. Having hot flashes as well, w/ that comes n/v. HR 70's sinus, BP 122/82 20GLAC 4mg  Zofran given.

## 2014-02-10 NOTE — ED Notes (Signed)
Pt states having a migraine that started about 2 hours ago, states 10/10, pt states still having some nausea after zofran was given by EMS, pt resting in bed w/ shirt over face.

## 2014-02-10 NOTE — Progress Notes (Signed)
  CARE MANAGEMENT ED NOTE 02/10/2014  Patient:  ZAURIA, DOMBEK   Account Number:  [id number]  Date Initiated:  02/10/2014  Documentation initiated by:  Livia Snellen  Subjective/Objective Assessment:   Patient presents to Ed with migraine     Subjective/Objective Assessment Detail:     Action/Plan:   Action/Plan Detail:   Anticipated DC Date:       Status Recommendation to Physician:   Result of Recommendation:    Other ED [place]  Other  PCP issues    Choice offered to / List presented to:            Status of service:  Completed, signed off  ED Comments:   ED Comments Detail:  EDCM spoke to patient and her husband at bedside.  Patient reports her pcp is Dr. Mills Koller in [place].  System updated

## 2014-02-10 NOTE — ED Notes (Signed)
Bed: RESA Expected date: 02/10/14 Expected time: 7:33 PM Means of arrival: Ambulance Comments: Migraine/IV

## 2014-02-10 NOTE — Discharge Instructions (Signed)
Migraine Headache A migraine headache is an intense, throbbing pain on one or both sides of your head. A migraine can last for 30 minutes to several hours. CAUSES  The exact cause of a migraine headache is not always known. However, a migraine may be caused when nerves in the brain become irritated and release chemicals that cause inflammation. This causes pain. Certain things may also trigger migraines, such as:  Alcohol.  Smoking.  Stress.  Menstruation.  Aged cheeses.  Foods or drinks that contain nitrates, glutamate, aspartame, or tyramine.  Lack of sleep.  Chocolate.  Caffeine.  Hunger.  Physical exertion.  Fatigue.  Medicines used to treat chest pain (nitroglycerine), birth control pills, estrogen, and some blood pressure medicines. SIGNS AND SYMPTOMS  Pain on one or both sides of your head.  Pulsating or throbbing pain.  Severe pain that prevents daily activities.  Pain that is aggravated by any physical activity.  Nausea, vomiting, or both.  Dizziness.  Pain with exposure to bright lights, loud noises, or activity.  General sensitivity to bright lights, loud noises, or smells. Before you get a migraine, you may get warning signs that a migraine is coming (aura). An aura may include:  Seeing flashing lights.  Seeing bright spots, halos, or zig-zag lines.  Having tunnel vision or blurred vision.  Having feelings of numbness or tingling.  Having trouble talking.  Having muscle weakness. DIAGNOSIS  A migraine headache is often diagnosed based on:  Symptoms.  Physical exam.  A CT scan or MRI of your head. These imaging tests cannot diagnose migraines, but they can help rule out other causes of headaches. TREATMENT Medicines may be given for pain and nausea. Medicines can also be given to help prevent recurrent migraines.  HOME CARE INSTRUCTIONS  Only take over-the-counter or prescription medicines for pain or discomfort as directed by your  health care provider. The use of long-term narcotics is not recommended.  Lie down in a dark, quiet room when you have a migraine.  Keep a journal to find out what may trigger your migraine headaches. For example, write down:  What you eat and drink.  How much sleep you get.  Any change to your diet or medicines.  Limit alcohol consumption.  Quit smoking if you smoke.  Get 7-9 hours of sleep, or as recommended by your health care provider.  Limit stress.  Keep lights dim if bright lights bother you and make your migraines worse. SEEK IMMEDIATE MEDICAL CARE IF:   Your migraine becomes severe.  You have a fever.  You have a stiff neck.  You have vision loss.  You have muscular weakness or loss of muscle control.  You start losing your balance or have trouble walking.  You feel faint or pass out.  You have severe symptoms that are different from your first symptoms. MAKE SURE YOU:   Understand these instructions.  Will watch your condition.  Will get help right away if you are not doing well or get worse. Document Released: 07/22/2005 Document Revised: 05/12/2013 Document Reviewed: 03/29/2013 [facility] Patient Information 2015 [place], [place]. This information is not intended to replace advice given to you by your health care provider. Make sure you discuss any questions you have with your health care provider. Urinary Tract Infection Urinary tract infections (UTIs) can develop anywhere along your urinary tract. Your urinary tract is your body's drainage system for removing wastes and extra water. Your urinary tract includes two kidneys, two ureters, a bladder, and a  urethra. Your kidneys are a pair of bean-shaped organs. Each kidney is about the size of your fist. They are located below your ribs, one on each side of your spine. CAUSES Infections are caused by microbes, which are microscopic organisms, including fungi, viruses, and bacteria. These organisms are so small  that they can only be seen through a microscope. Bacteria are the microbes that most commonly cause UTIs. SYMPTOMS  Symptoms of UTIs may vary by age and gender of the patient and by the location of the infection. Symptoms in young women typically include a frequent and intense urge to urinate and a painful, burning feeling in the bladder or urethra during urination. Older women and men are more likely to be tired, shaky, and weak and have muscle aches and abdominal pain. A fever may mean the infection is in your kidneys. Other symptoms of a kidney infection include pain in your back or sides below the ribs, nausea, and vomiting. DIAGNOSIS To diagnose a UTI, your caregiver will ask you about your symptoms. Your caregiver also will ask to provide a urine sample. The urine sample will be tested for bacteria and white blood cells. White blood cells are made by your body to help fight infection. TREATMENT  Typically, UTIs can be treated with medication. Because most UTIs are caused by a bacterial infection, they usually can be treated with the use of antibiotics. The choice of antibiotic and length of treatment depend on your symptoms and the type of bacteria causing your infection. HOME CARE INSTRUCTIONS  If you were prescribed antibiotics, take them exactly as your caregiver instructs you. Finish the medication even if you feel better after you have only taken some of the medication.  Drink enough water and fluids to keep your urine clear or pale yellow.  Avoid caffeine, tea, and carbonated beverages. They tend to irritate your bladder.  Empty your bladder often. Avoid holding urine for long periods of time.  Empty your bladder before and after sexual intercourse.  After a bowel movement, women should cleanse from front to back. Use each tissue only once. SEEK MEDICAL CARE IF:   You have back pain.  You develop a fever.  Your symptoms do not begin to resolve within 3 days. SEEK IMMEDIATE  MEDICAL CARE IF:   You have severe back pain or lower abdominal pain.  You develop chills.  You have nausea or vomiting.  You have continued burning or discomfort with urination. MAKE SURE YOU:   Understand these instructions.  Will watch your condition.  Will get help right away if you are not doing well or get worse. Document Released: 05/01/2005 Document Revised: 01/21/2012 Document Reviewed: 08/30/2011 [facility] Patient Information 2015 [place], [place]. This information is not intended to replace advice given to you by your health care provider. Make sure you discuss any questions you have with your health care provider.

## 2014-02-11 LAB — URINE CULTURE

## 2014-09-20 ENCOUNTER — Ambulatory Visit: Payer: Self-pay | Admitting: Podiatry

## 2014-09-29 ENCOUNTER — Encounter: Payer: Self-pay | Admitting: Podiatry

## 2014-09-29 ENCOUNTER — Ambulatory Visit (INDEPENDENT_AMBULATORY_CARE_PROVIDER_SITE_OTHER): Payer: BLUE CROSS/BLUE SHIELD

## 2014-09-29 ENCOUNTER — Ambulatory Visit (INDEPENDENT_AMBULATORY_CARE_PROVIDER_SITE_OTHER): Payer: BLUE CROSS/BLUE SHIELD | Admitting: Podiatry

## 2014-09-29 VITALS — BP 97/62 | HR 78 | Resp 16

## 2014-09-29 DIAGNOSIS — M79672 Pain in left foot: Secondary | ICD-10-CM

## 2014-09-29 DIAGNOSIS — M779 Enthesopathy, unspecified: Secondary | ICD-10-CM

## 2014-09-29 DIAGNOSIS — G5792 Unspecified mononeuropathy of left lower limb: Secondary | ICD-10-CM

## 2014-09-29 MED ORDER — METHYLPREDNISOLONE (PAK) 4 MG PO TABS: ORAL_TABLET | ORAL | Status: DC

## 2014-09-29 NOTE — Progress Notes (Signed)
   Subjective:    Patient ID: Yvonne Morales, female    DOB: May 27, 1966, 49 y.o.   MRN: [id number]  HPI Comments: "Its the left foot"  Patient c/o tingling, numbness toes and plantar forefoot for about 2-3 months. Exercise sometimes makes worse. No home treatment.     Review of Systems  All other systems reviewed and are negative.      Objective:   Physical Exam: I have reviewed her past medical history medications allergies surgery social history and review of systems. Pulses are strongly palpable bilateral. Neurologic sensorium is intact per Semmes-Weinstein monofilament. Deep tendon reflexes are intact bilateral muscle strength +5 over 5 dorsiflexion plantar flexors and inverters and everters all of his musculature is intact. Orthopedic evaluation and states a rectus foot mild contracture deformity at the second metatarsophalangeal joint left with tenderness on in range of motion and on palpation of the second metatarsophalangeal joint left foot. Radiographic evaluation does not demonstrate any type of osseus abnormalities in this area other than the aforementioned dorsiflexion at the second MTPJ. I see no fractures. Cutaneous evaluation demonstrates supple well-hydrated cutis no erythema edema cellulitis drainage or odor.        Assessment & Plan:  Assessment: Capsulitis second metatarsophalangeal joint left foot.  Plan: Discussed etiology pathology conservative versus surgical therapies. We discussed appropriate shoe gear stretching exercises ice therapy and sugar modifications. We discussed stiff soled shoes bearing her better option at this point. We injected the second metatarsophalangeal joint today with dexamethasone and local anesthetic after sterile Betadine skin prep. She tolerated the procedure well and there were no adverse effects. I also wrote a prescription for Medrol Dosepak to alleviate her burning and tingling in the inflammation of the joint. I will follow up with her  in 1 month if necessary. At which time we may need to consider orthotics

## 2014-10-27 ENCOUNTER — Ambulatory Visit (INDEPENDENT_AMBULATORY_CARE_PROVIDER_SITE_OTHER): Payer: BLUE CROSS/BLUE SHIELD | Admitting: Podiatry

## 2014-10-27 ENCOUNTER — Encounter: Payer: Self-pay | Admitting: Podiatry

## 2014-10-27 VITALS — BP 102/65 | HR 67

## 2014-10-27 DIAGNOSIS — M779 Enthesopathy, unspecified: Secondary | ICD-10-CM

## 2014-10-27 NOTE — Progress Notes (Signed)
   Subjective:    Patient ID: Yvonne Morales, female    DOB: 1966-05-02, 49 y.o.   MRN: [id number]  HPI  left foot capsulitis second metatarsophalangeal joint, inj last visit, effective initially, but has retrned about 80%  Review of Systems  All other systems reviewed and are negative.      Objective:   Physical Exam Olen she presents today for follow-up of her capsulitis second metatarsophalangeal joint of her left foot. Initially was doing well and has regressed approximately 80%. Pulses remain palpable left foot today she has pain on end range of motion of the second metatarsophalangeal joint left.        Assessment & Plan:  Assessment chronic capsulitis second metatarsophalangeal joint left.  Plan: We injected her left second metatarsophalangeal joint today after sterile Betadine skin prep. With dexamethasone and local anesthetic. She was also scanned for an orthotic.

## 2014-11-18 ENCOUNTER — Ambulatory Visit: Payer: BLUE CROSS/BLUE SHIELD | Admitting: *Deleted

## 2014-11-18 DIAGNOSIS — M779 Enthesopathy, unspecified: Secondary | ICD-10-CM

## 2014-11-18 NOTE — Progress Notes (Signed)
Patient ID: Yvonne Morales, female   DOB: 12-Apr-1966, 49 y.o.   MRN: [id number] PICKING UP INSERTS

## 2014-11-18 NOTE — Patient Instructions (Signed)

## 2017-06-17 ENCOUNTER — Other Ambulatory Visit: Payer: Self-pay | Admitting: Gastroenterology

## 2017-06-17 DIAGNOSIS — R1012 Left upper quadrant pain: Secondary | ICD-10-CM

## 2017-06-19 ENCOUNTER — Ambulatory Visit
Admission: RE | Admit: 2017-06-19 | Discharge: 2017-06-19 | Disposition: A | Discharge status: Home / Self Care | Payer: BLUE CROSS/BLUE SHIELD | Source: Ambulatory Visit | Attending: Gastroenterology | Admitting: Gastroenterology

## 2017-06-19 DIAGNOSIS — R1012 Left upper quadrant pain: Secondary | ICD-10-CM

## 2017-06-19 MED ORDER — IOPAMIDOL (ISOVUE-300) INJECTION 61%
100.0000 mL | Freq: Once | INTRAVENOUS | Status: AC | PRN
  Administered 2017-06-19: 100 mL via INTRAVENOUS

## 2017-06-23 ENCOUNTER — Encounter (HOSPITAL_COMMUNITY): Payer: Self-pay | Admitting: *Deleted

## 2017-06-23 ENCOUNTER — Other Ambulatory Visit: Payer: Self-pay | Admitting: Obstetrics and Gynecology

## 2017-06-23 ENCOUNTER — Other Ambulatory Visit: Payer: Self-pay

## 2017-07-09 ENCOUNTER — Ambulatory Visit (HOSPITAL_COMMUNITY): Payer: BLUE CROSS/BLUE SHIELD | Admitting: Anesthesiology

## 2017-07-09 ENCOUNTER — Encounter (HOSPITAL_COMMUNITY)
Admission: RE | Disposition: A | Discharge status: Home / Self Care | Payer: Self-pay | Source: Ambulatory Visit | Attending: Obstetrics and Gynecology

## 2017-07-09 ENCOUNTER — Encounter (HOSPITAL_COMMUNITY): Payer: Self-pay

## 2017-07-09 ENCOUNTER — Ambulatory Visit (HOSPITAL_COMMUNITY)
Admission: RE | Admit: 2017-07-09 | Discharge: 2017-07-09 | Disposition: A | Discharge status: Home / Self Care | Payer: BLUE CROSS/BLUE SHIELD | Source: Ambulatory Visit | Attending: Obstetrics and Gynecology | Admitting: Obstetrics and Gynecology

## 2017-07-09 DIAGNOSIS — D250 Submucous leiomyoma of uterus: Secondary | ICD-10-CM | POA: Diagnosis not present

## 2017-07-09 DIAGNOSIS — N921 Excessive and frequent menstruation with irregular cycle: Secondary | ICD-10-CM | POA: Insufficient documentation

## 2017-07-09 DIAGNOSIS — N858 Other specified noninflammatory disorders of uterus: Secondary | ICD-10-CM | POA: Diagnosis present

## 2017-07-09 DIAGNOSIS — N840 Polyp of corpus uteri: Secondary | ICD-10-CM | POA: Diagnosis not present

## 2017-07-09 HISTORY — DX: Vitamin D deficiency, unspecified: E55.9

## 2017-07-09 HISTORY — PX: DILATATION & CURETTAGE/HYSTEROSCOPY WITH MYOSURE: SHX6511

## 2017-07-09 LAB — CBC
HCT: 39.4 % (ref 36.0–46.0)
Hemoglobin: 12.7 g/dL (ref 12.0–15.0)
MCH: 28.5 pg (ref 26.0–34.0)
MCHC: 32.2 g/dL (ref 30.0–36.0)
MCV: 88.3 fL (ref 78.0–100.0)
PLATELETS: 205 10*3/uL (ref 150–400)
RBC: 4.46 MIL/uL (ref 3.87–5.11)
RDW: 13.3 % (ref 11.5–15.5)
WBC: 3.6 10*3/uL — AB (ref 4.0–10.5)

## 2017-07-09 SURGERY — DILATATION & CURETTAGE/HYSTEROSCOPY WITH MYOSURE
Anesthesia: General | Site: Vagina

## 2017-07-09 MED ORDER — KETOROLAC TROMETHAMINE 30 MG/ML IJ SOLN
INTRAMUSCULAR | Status: AC
  Filled 2017-07-09: qty 2

## 2017-07-09 MED ORDER — OXYCODONE HCL 5 MG/5ML PO SOLN: 5.0000 mg | Freq: Once | ORAL | Status: DC | PRN

## 2017-07-09 MED ORDER — ONDANSETRON HCL 4 MG/2ML IJ SOLN
INTRAMUSCULAR | Status: AC
  Filled 2017-07-09: qty 2

## 2017-07-09 MED ORDER — MIDAZOLAM HCL 2 MG/2ML IJ SOLN
INTRAMUSCULAR | Status: AC
  Filled 2017-07-09: qty 2

## 2017-07-09 MED ORDER — ONDANSETRON HCL 4 MG/2ML IJ SOLN
INTRAMUSCULAR | Status: DC | PRN
  Administered 2017-07-09: 4 mg via INTRAVENOUS

## 2017-07-09 MED ORDER — KETOROLAC TROMETHAMINE 30 MG/ML IJ SOLN: 30.0000 mg | Freq: Once | INTRAMUSCULAR | Status: DC | PRN

## 2017-07-09 MED ORDER — PROPOFOL 10 MG/ML IV BOLUS
INTRAVENOUS | Status: AC
  Filled 2017-07-09: qty 20

## 2017-07-09 MED ORDER — PROPOFOL 10 MG/ML IV BOLUS
INTRAVENOUS | Status: DC | PRN
  Administered 2017-07-09: 50 mg via INTRAVENOUS
  Administered 2017-07-09: 150 mg via INTRAVENOUS

## 2017-07-09 MED ORDER — OXYCODONE HCL 5 MG PO TABS: 5.0000 mg | ORAL_TABLET | Freq: Once | ORAL | Status: DC | PRN

## 2017-07-09 MED ORDER — FENTANYL CITRATE (PF) 250 MCG/5ML IJ SOLN
INTRAMUSCULAR | Status: AC
  Filled 2017-07-09: qty 5

## 2017-07-09 MED ORDER — LACTATED RINGERS IV SOLN
INTRAVENOUS | Status: DC
  Administered 2017-07-09 (×3): via INTRAVENOUS

## 2017-07-09 MED ORDER — FENTANYL CITRATE (PF) 100 MCG/2ML IJ SOLN: 25.0000 ug | INTRAMUSCULAR | Status: DC | PRN

## 2017-07-09 MED ORDER — SCOPOLAMINE 1 MG/3DAYS TD PT72
1.0000 | MEDICATED_PATCH | Freq: Once | TRANSDERMAL | Status: DC
  Administered 2017-07-09: 1.5 mg via TRANSDERMAL

## 2017-07-09 MED ORDER — ACETAMINOPHEN 160 MG/5ML PO SOLN: 325.0000 mg | ORAL | Status: DC | PRN

## 2017-07-09 MED ORDER — FENTANYL CITRATE (PF) 100 MCG/2ML IJ SOLN
INTRAMUSCULAR | Status: DC | PRN
  Administered 2017-07-09: 100 ug via INTRAVENOUS

## 2017-07-09 MED ORDER — DEXAMETHASONE SODIUM PHOSPHATE 10 MG/ML IJ SOLN
INTRAMUSCULAR | Status: DC | PRN
  Administered 2017-07-09: 10 mg via INTRAVENOUS

## 2017-07-09 MED ORDER — LIDOCAINE HCL (CARDIAC) 20 MG/ML IV SOLN
INTRAVENOUS | Status: AC
  Filled 2017-07-09: qty 5

## 2017-07-09 MED ORDER — ACETAMINOPHEN 325 MG PO TABS: 325.0000 mg | ORAL_TABLET | ORAL | Status: DC | PRN

## 2017-07-09 MED ORDER — KETOROLAC TROMETHAMINE 30 MG/ML IJ SOLN
INTRAMUSCULAR | Status: DC | PRN
  Administered 2017-07-09: 30 mg via INTRAVENOUS
  Administered 2017-07-09: 30 mg via INTRAMUSCULAR

## 2017-07-09 MED ORDER — MIDAZOLAM HCL 2 MG/2ML IJ SOLN
INTRAMUSCULAR | Status: DC | PRN
  Administered 2017-07-09: 2 mg via INTRAVENOUS

## 2017-07-09 MED ORDER — SCOPOLAMINE 1 MG/3DAYS TD PT72
MEDICATED_PATCH | TRANSDERMAL | Status: AC
  Administered 2017-07-09: 1.5 mg via TRANSDERMAL
  Filled 2017-07-09: qty 1

## 2017-07-09 MED ORDER — LIDOCAINE HCL (CARDIAC) 20 MG/ML IV SOLN
INTRAVENOUS | Status: DC | PRN
  Administered 2017-07-09: 80 mg via INTRAVENOUS

## 2017-07-09 MED ORDER — MEPERIDINE HCL 25 MG/ML IJ SOLN: 6.2500 mg | INTRAMUSCULAR | Status: DC | PRN

## 2017-07-09 MED ORDER — PROMETHAZINE HCL 25 MG/ML IJ SOLN: 6.2500 mg | INTRAMUSCULAR | Status: DC | PRN

## 2017-07-09 SURGICAL SUPPLY — 16 items
CANISTER SUCT 3000ML PPV (MISCELLANEOUS) ×3 IMPLANT
CATH ROBINSON RED A/P 16FR (CATHETERS) ×3 IMPLANT
CONTAINER PREFILL 10% NBF 60ML (FORM) ×6 IMPLANT
DEVICE MYOSURE LITE (MISCELLANEOUS) ×2 IMPLANT
DEVICE MYOSURE REACH (MISCELLANEOUS) IMPLANT
FILTER ARTHROSCOPY CONVERTOR (FILTER) ×3 IMPLANT
GLOVE BIOGEL PI IND STRL 7.0 (GLOVE) ×2 IMPLANT
GLOVE BIOGEL PI INDICATOR 7.0 (GLOVE) ×4
GLOVE ECLIPSE 6.5 STRL STRAW (GLOVE) ×3 IMPLANT
GOWN STRL REUS W/TWL LRG LVL3 (GOWN DISPOSABLE) ×6 IMPLANT
PACK VAGINAL MINOR WOMEN LF (CUSTOM PROCEDURE TRAY) ×3 IMPLANT
PAD OB MATERNITY 4.3X12.25 (PERSONAL CARE ITEMS) ×3 IMPLANT
SEAL ROD LENS SCOPE MYOSURE (ABLATOR) ×3 IMPLANT
TOWEL OR 17X24 6PK STRL BLUE (TOWEL DISPOSABLE) ×6 IMPLANT
TUBING AQUILEX INFLOW (TUBING) ×3 IMPLANT
TUBING AQUILEX OUTFLOW (TUBING) ×3 IMPLANT

## 2017-07-09 NOTE — Discharge Instructions (Addendum)
CALL  IF TEMP>100.4, NOTHING PER VAGINA X 1WK, CALL IF SOAKING A MAXI  PAD EVERY HOUR OR MORE FREQUENTLY  DISCHARGE INSTRUCTIONS: HYSTEROSCOPY / ENDOMETRIAL ABLATION The following instructions have been prepared to help you care for yourself upon your return home.  May Remove Scop patch on or before: Saturday December 8  May take Ibuprofen after: 9:15 pm tonight  May take stool softner while taking narcotic pain medication to prevent constipation.  Drink plenty of water.  Personal hygiene:  Use sanitary pads for vaginal drainage, not tampons.  Shower the day after your procedure.  NO tub baths, pools or Jacuzzis for 2-3 weeks.  Wipe front to back after using the bathroom.  Activity and limitations:  Do NOT drive or operate any equipment for 24 hours. The effects of anesthesia are still present and drowsiness may result.  Do NOT rest in bed all day.  Walking is encouraged.  Walk up and down stairs slowly.  You may resume your normal activity in one to two days or as indicated by your physician. Sexual activity: NO intercourse for at least 2 weeks after the procedure, or as indicated by your Doctor.  Diet: Eat a light meal as desired this evening. You may resume your usual diet tomorrow.  Return to Work: You may resume your work activities in one to two days or as indicated by Marine scientist.  What to expect after your surgery: Expect to have vaginal bleeding/discharge for 2-3 days and spotting for up to 10 days. It is not unusual to have soreness for up to 1-2 weeks. You may have a slight burning sensation when you urinate for the first day. Mild cramps may continue for a couple of days. You may have a regular period in 2-6 weeks.  Call your doctor for any of the following:  Excessive vaginal bleeding or clotting, saturating and changing one pad every hour.  Inability to urinate 6 hours after discharge from hospital.  Pain not relieved by pain medication.  Fever of  100.4 F or greater.  Unusual vaginal discharge or odor.  Return to office _________________Call for an appointment ___________________ Patients signature: ______________________ Nurses signature ________________________  Post Anesthesia Care Unit [phone]   Post Anesthesia Home Care Instructions  Activity: Get plenty of rest for the remainder of the day. A responsible individual must stay with you for 24 hours following the procedure.  For the next 24 hours, DO NOT: -Drive a car -Paediatric nurse -Drink alcoholic beverages -Take any medication unless instructed by your physician -Make any legal decisions or sign important papers.  Meals: Start with liquid foods such as gelatin or soup. Progress to regular foods as tolerated. Avoid greasy, spicy, heavy foods. If nausea and/or vomiting occur, drink only clear liquids until the nausea and/or vomiting subsides. Call your physician if vomiting continues.  Special Instructions/Symptoms: Your throat may feel dry or sore from the anesthesia or the breathing tube placed in your throat during surgery. If this causes discomfort, gargle with warm salt water. The discomfort should disappear within 24 hours.  If you had a scopolamine patch placed behind your ear for the management of post- operative nausea and/or vomiting:  1. The medication in the patch is effective for 72 hours, after which it should be removed.  Wrap patch in a tissue and discard in the trash. Wash hands thoroughly with soap and water. 2. You may remove the patch earlier than 72 hours if you experience unpleasant side effects which may include dry  mouth, dizziness or visual disturbances. 3. Avoid touching the patch. Wash your hands with soap and water after contact with the patch.

## 2017-07-09 NOTE — H&P (Signed)
Yvonne Morales is an 51 y.o. female.P2 MBF presents for surgical management of endom mass noted on w/u for IMB  Pertinent Gynecological History: Menses: regular with IMB        Bleeding: intermenstrual bleeding Contraception: none DES exposure: denies Blood transfusions: none Sexually transmitted diseases: no past history Previous GYN Procedures: DNC  Last mammogram: normal Date: 2018 Last pap: normal Date: 2018 OB History: G3 P2   Menstrual History: Menarche age: n/a No LMP recorded. Patient is perimenopausal.    Past Medical History:  Diagnosis Date  . Migraine   . Vitamin D deficiency     Past Surgical History:  Procedure Laterality Date  . BREAST SURGERY Left    lumpectomy - benign  . CESAREAN SECTION     x 2  . COLONOSCOPY    . DILATION AND CURETTAGE OF UTERUS     mab  . VARICOSE VEIN SURGERY Right   . WISDOM TOOTH EXTRACTION      History reviewed. No pertinent family history.  Social History:  reports that  has never smoked. she has never used smokeless tobacco. She reports that she does not drink alcohol or use drugs.  Allergies:  Allergies  Allergen Reactions  . Latex Rash    No medications prior to admission.    Review of Systems  All other systems reviewed and are negative.   Height 5\' 4"  (1.626 m), weight 74.8 kg (165 lb). Physical Exam  Constitutional: She is oriented to person, place, and time. She appears well-developed and well-nourished.  Eyes: EOM are normal.  Neck: Neck supple.  Cardiovascular: Regular rhythm.  Respiratory: Breath sounds normal.  Genitourinary: Vagina normal and uterus normal.  Genitourinary Comments: Cervix nl Adnexa nl  Musculoskeletal: She exhibits no edema.  Neurological: She is alert and oriented to person, place, and time.  Skin: Skin is warm and dry.  Psychiatric: She has a normal mood and affect.    No results found for this or any previous visit (from the past 24 hour(s)).  No results  found.  Assessment/Plan: IMB Endometrial mass P) dx hysteroscopy, D&C, removal of endom mass. Procedure explained. Risk reviewed: infection bleeding, uterine perforation and its risk, internal scar tissue, fluid overload, thermal injury. ALL ? answered  Yvonne Morales A Yvonne Morales 07/09/2017, 6:42 AM

## 2017-07-09 NOTE — Transfer of Care (Signed)
Immediate Anesthesia Transfer of Care Note  Patient: Yvonne Morales  Procedure(s) Performed: DILATATION & CURETTAGE/HYSTEROSCOPY WITH MYOSURE (N/A Vagina )  Patient Location: PACU  Anesthesia Type:General  Level of Consciousness: sedated  Airway & Oxygen Therapy: Patient Spontanous Breathing and Patient connected to nasal cannula oxygen  Post-op Assessment: Report given to RN  Post vital signs: Reviewed and stable  Last Vitals:  Vitals:   07/09/17 1252  BP: 109/77  Pulse: 64  Resp: 16  Temp: 36.5 C  SpO2: 100%    Last Pain:  Vitals:   07/09/17 1252  TempSrc: Oral      Patients Stated Pain Goal: 3 (21/74/71 5953)  Complications: No apparent anesthesia complications

## 2017-07-09 NOTE — Anesthesia Postprocedure Evaluation (Signed)
Anesthesia Post Note  Patient: Yvonne Morales  Procedure(s) Performed: DILATATION & CURETTAGE/HYSTEROSCOPY WITH MYOSURE (N/A Vagina )     Patient location during evaluation: PACU Anesthesia Type: General Level of consciousness: awake Pain management: pain level controlled Vital Signs Assessment: post-procedure vital signs reviewed and stable Respiratory status: spontaneous breathing Cardiovascular status: stable Postop Assessment: no apparent nausea or vomiting Anesthetic complications: no    Last Vitals:  Vitals:   07/09/17 1532 07/09/17 1545  BP: 124/81 131/84  Pulse: 63 65  Resp: 13 14  Temp: (!) 36.4 C   SpO2: 100% 100%    Last Pain:  Vitals:   07/09/17 1545  TempSrc:   PainSc: 0-No pain   Pain Goal: Patients Stated Pain Goal: 3 (07/09/17 1545)               [place]

## 2017-07-09 NOTE — Brief Op Note (Signed)
07/09/2017  3:18 PM  PATIENT:  Yvonne Morales  51 y.o. female  PRE-OPERATIVE DIAGNOSIS:  Intermenstruall Bleeding, Endometrial mass  POST-OPERATIVE DIAGNOSIS:  IMB, ENDOMETRIAL POLYP  PROCEDURE: diagnostic hysteroscopy, hysteroscopic resection of endometrial polyp  SURGEON:  Surgeon(s) and Role:    * Servando Salina, MD - Primary  PHYSICIAN ASSISTANT:   ASSISTANTS: none   ANESTHESIA:   general Findings: nl ostia, right fundal endom polyp EBL:  5 mL   BLOOD ADMINISTERED:none  DRAINS: none   LOCAL MEDICATIONS USED:  NONE  SPECIMEN:  Source of Specimen:  EMC with endom polyp  DISPOSITION OF SPECIMEN:  PATHOLOGY  COUNTS:  YES  TOURNIQUET:  * No tourniquets in log *  DICTATION: .Other Dictation: Dictation Number [id number]  PLAN OF CARE: Discharge to home after PACU  PATIENT DISPOSITION:  PACU - hemodynamically stable.   Delay start of Pharmacological VTE agent (>24hrs) due to surgical blood loss or risk of bleeding: no

## 2017-07-09 NOTE — Anesthesia Procedure Notes (Addendum)
Procedure Name: LMA Insertion Date/Time: 07/09/2017 2:58 PM Performed by: Asher Muir, CRNA Pre-anesthesia Checklist: Patient being monitored, Patient identified, Emergency Drugs available and Suction available Patient Re-evaluated:Patient Re-evaluated prior to induction Oxygen Delivery Method: Circle system utilized Preoxygenation: Pre-oxygenation with 100% oxygen Induction Type: IV induction and Inhalational induction Ventilation: Mask ventilation without difficulty LMA: LMA inserted LMA Size: 4.0 Number of attempts: 1 Dental Injury: Teeth and Oropharynx as per pre-operative assessment

## 2017-07-09 NOTE — Anesthesia Preprocedure Evaluation (Signed)
Anesthesia Evaluation  Patient identified by MRN, date of birth, ID band Patient awake    Reviewed: Allergy & Precautions, H&P , NPO status , Patient's Chart, lab work & pertinent test results  Airway Mallampati: I  TM Distance: >3 FB Neck ROM: full    Dental no notable dental hx. (+) Teeth Intact   Pulmonary neg pulmonary ROS,    breath sounds clear to auscultation       Cardiovascular negative cardio ROS Normal cardiovascular exam Rhythm:regular Rate:Normal     Neuro/Psych negative psych ROS   GI/Hepatic negative GI ROS, Neg liver ROS,   Endo/Other  negative endocrine ROS  Renal/GU negative Renal ROS  negative genitourinary   Musculoskeletal negative musculoskeletal ROS (+)   Abdominal (+) + obese,   Peds  Hematology negative hematology ROS (+)   Anesthesia Other Findings   Reproductive/Obstetrics negative OB ROS                             Anesthesia Physical Anesthesia Plan  ASA: II  Anesthesia Plan: General   Post-op Pain Management:    Induction: Intravenous  PONV Risk Score and Plan: 4 or greater and Scopolamine patch - Pre-op, Dexamethasone and Ondansetron  Airway Management Planned: LMA  Additional Equipment:   Intra-op Plan:   Post-operative Plan:   Informed Consent: I have reviewed the patients History and Physical, chart, labs and discussed the procedure including the risks, benefits and alternatives for the proposed anesthesia with the patient or authorized representative who has indicated his/her understanding and acceptance.   Dental Advisory Given  Plan Discussed with: CRNA and Surgeon  Anesthesia Plan Comments:         Anesthesia Quick Evaluation

## 2017-07-10 ENCOUNTER — Encounter (HOSPITAL_COMMUNITY): Payer: Self-pay | Admitting: Obstetrics and Gynecology

## 2017-07-10 NOTE — Op Note (Signed)
Yvonne Morales, Yvonne Morales NO.:  [id number]  MEDICAL RECORD NO.:  [id number]  LOCATION:                                 FACILITY:  PHYSICIAN:  Servando Salina, M.D.DATE OF BIRTH:  October 26, 1965  DATE OF PROCEDURE:  07/09/2017 DATE OF DISCHARGE:                              OPERATIVE REPORT   PREOPERATIVE DIAGNOSES:  Intermenstrual bleeding, endometrial mass.  PROCEDURES:  Diagnostic hysteroscopy, hysteroscopic resection of endometrial polyp, dilation and curettage.  POSTOPERATIVE DIAGNOSES:  Intermenstrual bleeding, endometrial polyp.  ANESTHESIA:  General.  SURGEON:  Servando Salina, M.D.  ASSISTANT:  None.  DESCRIPTION OF PROCEDURE:  Under adequate general anesthesia, the patient was placed in dorsal lithotomy position.  She was sterilely prepped and draped in usual fashion.  The bladder was catheterized for moderate amount of urine.  Examination under anesthesia revealed anteverted uterus.  No adnexal masses could be appreciated.  Bivalve speculum was placed in the vagina.  Single-tooth tenaculum was placed on the anterior lip of the cervix.  The cervix was serially dilated up to #21 [facility] dilator.  A MyoSure hysteroscope was introduced into the uterine cavity.  Both tubal ostia could be seen.  Medial to the right tubal ostia, there was a polypoid lesion, consistent with a polyp. Using the Lite resectoscope, the polypoid lesion was resected as was the endometrial cavity.  Once this was done, the endocervical canal was inspected and all instruments were then removed from the vagina.  SPECIMEN:  Endometrial curetting with polyp was sent to Pathology.  ESTIMATED BLOOD LOSS:  Minimal.  FLUID DEFICIT:  305 mL.  COMPLICATIONS:  None.  The patient tolerated the procedure well, was transferred to recovery room in stable condition.     Servando Salina, M.D.   ______________________________ Servando Salina, M.D.    [place]/MEDQ  D:  07/09/2017   T:  07/10/2017  Job:  [id number]

## 2018-03-12 DIAGNOSIS — Z713 Dietary counseling and surveillance: Secondary | ICD-10-CM | POA: Diagnosis not present

## 2018-03-24 DIAGNOSIS — T07XXXA Unspecified multiple injuries, initial encounter: Secondary | ICD-10-CM | POA: Diagnosis not present

## 2018-04-02 DIAGNOSIS — Z713 Dietary counseling and surveillance: Secondary | ICD-10-CM | POA: Diagnosis not present

## 2018-05-22 DIAGNOSIS — Z713 Dietary counseling and surveillance: Secondary | ICD-10-CM | POA: Diagnosis not present

## 2018-07-07 DIAGNOSIS — S336XXA Sprain of sacroiliac joint, initial encounter: Secondary | ICD-10-CM | POA: Diagnosis not present

## 2018-07-09 DIAGNOSIS — Z713 Dietary counseling and surveillance: Secondary | ICD-10-CM | POA: Diagnosis not present

## 2018-08-07 DIAGNOSIS — Z713 Dietary counseling and surveillance: Secondary | ICD-10-CM | POA: Diagnosis not present

## 2018-09-24 DIAGNOSIS — Z713 Dietary counseling and surveillance: Secondary | ICD-10-CM | POA: Diagnosis not present

## 2018-10-16 DIAGNOSIS — Z713 Dietary counseling and surveillance: Secondary | ICD-10-CM | POA: Diagnosis not present

## 2018-11-05 DIAGNOSIS — Z713 Dietary counseling and surveillance: Secondary | ICD-10-CM | POA: Diagnosis not present

## 2018-12-03 DIAGNOSIS — Z713 Dietary counseling and surveillance: Secondary | ICD-10-CM | POA: Diagnosis not present

## 2018-12-31 DIAGNOSIS — Z713 Dietary counseling and surveillance: Secondary | ICD-10-CM | POA: Diagnosis not present

## 2019-01-28 DIAGNOSIS — Z713 Dietary counseling and surveillance: Secondary | ICD-10-CM | POA: Diagnosis not present

## 2019-02-17 DIAGNOSIS — Z20828 Contact with and (suspected) exposure to other viral communicable diseases: Secondary | ICD-10-CM | POA: Diagnosis not present

## 2019-02-25 DIAGNOSIS — Z713 Dietary counseling and surveillance: Secondary | ICD-10-CM | POA: Diagnosis not present

## 2019-03-25 DIAGNOSIS — Z713 Dietary counseling and surveillance: Secondary | ICD-10-CM | POA: Diagnosis not present

## 2019-04-22 DIAGNOSIS — Z713 Dietary counseling and surveillance: Secondary | ICD-10-CM | POA: Diagnosis not present

## 2019-05-04 DIAGNOSIS — Z1151 Encounter for screening for human papillomavirus (HPV): Secondary | ICD-10-CM | POA: Diagnosis not present

## 2019-05-04 DIAGNOSIS — Z6830 Body mass index (BMI) 30.0-30.9, adult: Secondary | ICD-10-CM | POA: Diagnosis not present

## 2019-05-04 DIAGNOSIS — Z01419 Encounter for gynecological examination (general) (routine) without abnormal findings: Secondary | ICD-10-CM | POA: Diagnosis not present

## 2019-05-11 DIAGNOSIS — M25551 Pain in right hip: Secondary | ICD-10-CM | POA: Diagnosis not present

## 2019-05-11 DIAGNOSIS — M545 Low back pain: Secondary | ICD-10-CM | POA: Diagnosis not present

## 2019-05-12 DIAGNOSIS — Z0000 Encounter for general adult medical examination without abnormal findings: Secondary | ICD-10-CM | POA: Diagnosis not present

## 2019-05-12 DIAGNOSIS — Z131 Encounter for screening for diabetes mellitus: Secondary | ICD-10-CM | POA: Diagnosis not present

## 2019-05-12 DIAGNOSIS — Z130 Encounter for screening for diseases of the blood and blood-forming organs and certain disorders involving the immune mechanism: Secondary | ICD-10-CM | POA: Diagnosis not present

## 2019-05-12 DIAGNOSIS — Z13220 Encounter for screening for lipoid disorders: Secondary | ICD-10-CM | POA: Diagnosis not present

## 2019-05-12 DIAGNOSIS — Z1329 Encounter for screening for other suspected endocrine disorder: Secondary | ICD-10-CM | POA: Diagnosis not present

## 2019-05-20 DIAGNOSIS — Z713 Dietary counseling and surveillance: Secondary | ICD-10-CM | POA: Diagnosis not present

## 2019-05-26 DIAGNOSIS — B353 Tinea pedis: Secondary | ICD-10-CM | POA: Diagnosis not present

## 2019-05-28 DIAGNOSIS — Z1231 Encounter for screening mammogram for malignant neoplasm of breast: Secondary | ICD-10-CM | POA: Diagnosis not present

## 2019-05-28 DIAGNOSIS — Z803 Family history of malignant neoplasm of breast: Secondary | ICD-10-CM | POA: Diagnosis not present

## 2019-05-31 DIAGNOSIS — M545 Low back pain: Secondary | ICD-10-CM | POA: Diagnosis not present

## 2019-06-03 DIAGNOSIS — N183 Chronic kidney disease, stage 3 unspecified: Secondary | ICD-10-CM | POA: Diagnosis not present

## 2019-06-03 DIAGNOSIS — N182 Chronic kidney disease, stage 2 (mild): Secondary | ICD-10-CM | POA: Diagnosis not present

## 2019-06-03 DIAGNOSIS — E785 Hyperlipidemia, unspecified: Secondary | ICD-10-CM | POA: Diagnosis not present

## 2019-06-03 DIAGNOSIS — M545 Low back pain: Secondary | ICD-10-CM | POA: Diagnosis not present

## 2019-06-07 DIAGNOSIS — M6281 Muscle weakness (generalized): Secondary | ICD-10-CM | POA: Diagnosis not present

## 2019-06-07 DIAGNOSIS — M545 Low back pain: Secondary | ICD-10-CM | POA: Diagnosis not present

## 2019-07-15 DIAGNOSIS — Z713 Dietary counseling and surveillance: Secondary | ICD-10-CM | POA: Diagnosis not present

## 2019-10-06 ENCOUNTER — Ambulatory Visit (INDEPENDENT_AMBULATORY_CARE_PROVIDER_SITE_OTHER): Payer: BC Managed Care – PPO | Admitting: Family Medicine

## 2019-10-06 ENCOUNTER — Other Ambulatory Visit: Payer: Self-pay

## 2019-10-06 ENCOUNTER — Encounter: Payer: Self-pay | Admitting: Family Medicine

## 2019-10-06 VITALS — BP 119/75 | HR 54 | Temp 97.9°F | Resp 15 | Ht 64.0 in | Wt 159.0 lb

## 2019-10-06 DIAGNOSIS — Z7689 Persons encountering health services in other specified circumstances: Secondary | ICD-10-CM | POA: Diagnosis not present

## 2019-10-06 DIAGNOSIS — E559 Vitamin D deficiency, unspecified: Secondary | ICD-10-CM

## 2019-10-06 NOTE — Progress Notes (Signed)
New Patient Office Visit  Subjective:  Patient ID: Yvonne Morales, female    DOB: 08-20-1965  Age: 54 y.o. MRN: [id number]  CC:  Chief Complaint  Patient presents with  . Establish Care    pt has no concerns states she feels great    HPI Yvonne Morales presents for   Patient referred here by Dr. Garwin Brothers [facility]) to establish   She is s/p cyst that was removed from the uterus. She denies history of hypertension or diabetes  Vitamin D deficiency Patient is currently taking her vitamin D deficiency She reports that she is taking vitamin D3 over the counter 5,000 units a day.    Past Medical History:  Diagnosis Date  . Allergy   . Migraine   . Vitamin D deficiency     Past Surgical History:  Procedure Laterality Date  . BREAST SURGERY Left    lumpectomy - benign  . CESAREAN SECTION     x 2  . COLONOSCOPY    . DILATATION & CURETTAGE/HYSTEROSCOPY WITH MYOSURE N/A 07/09/2017   Procedure: [place];  Surgeon: Servando Salina, MD;  Location: [facility];  Service: Gynecology;  Laterality: N/A;  . DILATION AND CURETTAGE OF UTERUS     mab  . VARICOSE VEIN SURGERY Right   . WISDOM TOOTH EXTRACTION      Family History  Problem Relation Age of Onset  . Cancer Mother   . Diabetes Mother   . Hypertension Mother   . Hypertension Father   . Heart disease Father   . Diabetes Maternal Grandmother   . Diabetes Paternal Grandmother   . Hypertension Paternal Grandmother   . Cancer Paternal Grandfather     Social History   Socioeconomic History  . Marital status: Married    Spouse name: Not on file  . Number of children: Not on file  . Years of education: Not on file  . Highest education level: Not on file  Occupational History  . Occupation: CEO  Tobacco Use  . Smoking status: Never Smoker  . Smokeless tobacco: Never Used  Substance and Sexual Activity  . Alcohol use: No  . Drug use: No  . Sexual activity: Never      Birth control/protection: Condom  Other Topics Concern  . Not on file  Social History Narrative  . Not on file   Social Determinants of Health   Financial Resource Strain:   . Difficulty of Paying Living Expenses: Not on file  Food Insecurity: No Food Insecurity  . Worried About Charity fundraiser in the Last Year: Never true  . Ran Out of Food in the Last Year: Never true  Transportation Needs:   . Lack of Transportation (Medical): Not on file  . Lack of Transportation (Non-Medical): Not on file  Physical Activity:   . Days of Exercise per Week: Not on file  . Minutes of Exercise per Session: Not on file  Stress:   . Feeling of Stress : Not on file  Social Connections:   . Frequency of Communication with Friends and Family: Not on file  . Frequency of Social Gatherings with Friends and Family: Not on file  . Attends Religious Services: Not on file  . Active Member of Clubs or Organizations: Not on file  . Attends Archivist Meetings: Not on file  . Marital Status: Not on file  Intimate Partner Violence:   . Fear of Current or Ex-Partner: Not on file  .  Emotionally Abused: Not on file  . Physically Abused: Not on file  . Sexually Abused: Not on file    ROS Review of Systems Review of Systems  Constitutional: Negative for activity change, appetite change, chills and fever.  HENT: Negative for congestion, nosebleeds, trouble swallowing and voice change.   Respiratory: Negative for cough, shortness of breath and wheezing.   Gastrointestinal: Negative for diarrhea, nausea and vomiting.  Genitourinary: Negative for difficulty urinating, dysuria, flank pain and hematuria.  Musculoskeletal: Negative for back pain, joint swelling and neck pain.  Neurological: Negative for dizziness, speech difficulty, light-headedness and numbness.  See HPI. All other review of systems negative.   Objective:   Today's Vitals: BP 119/75   Pulse (!) 54   Temp 97.9 F (36.6 C)  (Temporal)   Resp 15   Ht 5\' 4"  (1.626 m)   Wt 159 lb (72.1 kg)   SpO2 94%   BMI 27.29 kg/m   Physical Exam  Physical Exam  Constitutional: Oriented to person, place, and time. Appears well-developed and well-nourished.  HENT:  Head: Normocephalic and atraumatic.  Eyes: Conjunctivae and EOM are normal.  Cardiovascular: Normal rate, regular rhythm, normal heart sounds and intact distal pulses.  No murmur heard. Pulmonary/Chest: Effort normal and breath sounds normal. No stridor. No respiratory distress. Has no wheezes.  Neurological: Is alert and oriented to person, place, and time.  Skin: Skin is warm. Capillary refill takes less than 2 seconds.  Psychiatric: Has a normal mood and affect. Behavior is normal. Judgment and thought content normal.   Assessment & Plan:   Problem List Items Addressed This Visit    None    Visit Diagnoses    Establishing care with new doctor, encounter for    -  Primary   Vitamin D deficiency        will get records from Dr. Garwin Brothers For now will await those results before checking labs Patient is a healthy 54 year old whose health maintenance is up to date.   Outpatient Encounter Medications as of 10/06/2019  Medication Sig  . Cholecalciferol (VITAMIN D3) 5000 units CAPS Take 5,000 Units by mouth daily.  Marland Kitchen linaclotide (LINZESS) 72 MCG capsule Take 72 mcg daily by mouth.  . Probiotic CAPS Take 1 capsule by mouth daily before supper.   No facility-administered encounter medications on file as of 10/06/2019.    Follow-up: No follow-ups on file.   Yvonne Moron, MD

## 2019-10-06 NOTE — Patient Instructions (Signed)
° ° ° °  If you have lab work done today you will be contacted with your lab results within the next 2 weeks.  If you have not heard from us then please contact us. The fastest way to get your results is to register for My Chart. ° ° °IF you received an x-ray today, you will receive an invoice from [facility]. Please contact [facility] at [phone] with questions or concerns regarding your invoice.  ° °IF you received labwork today, you will receive an invoice from [facility]. Please contact [facility] at [phone] with questions or concerns regarding your invoice.  ° °Our billing staff will not be able to assist you with questions regarding bills from these companies. ° °You will be contacted with the lab results as soon as they are available. The fastest way to get your results is to activate your My Chart account. Instructions are located on the last page of this paperwork. If you have not heard from us regarding the results in 2 weeks, please contact this office. °  ° ° ° °

## 2019-10-07 ENCOUNTER — Ambulatory Visit: Payer: BC Managed Care – PPO | Attending: Family

## 2019-10-07 DIAGNOSIS — Z23 Encounter for immunization: Secondary | ICD-10-CM | POA: Insufficient documentation

## 2019-10-07 NOTE — Progress Notes (Signed)
   Covid-19 Vaccination Clinic  Name:  Yvonne Morales    MRN: [id number] DOB: 06/23/1966  10/07/2019  [name] was observed post Covid-19 immunization for 15 minutes without incident. She was provided with Vaccine Information Sheet and instruction to access the V-Safe system.   [name] was instructed to call 911 with any severe reactions post vaccine: Marland Kitchen Difficulty breathing  . Swelling of face and throat  . A fast heartbeat  . A bad rash all over body  . Dizziness and weakness   Immunizations Administered    Name Date Dose VIS Date Route   Moderna COVID-19 Vaccine 10/07/2019  2:07 PM 0.5 mL 07/06/2019 Intramuscular   Manufacturer: Moderna   Lot: [id number]   [id number]

## 2019-11-09 ENCOUNTER — Ambulatory Visit: Payer: BC Managed Care – PPO | Attending: Internal Medicine

## 2019-11-09 DIAGNOSIS — Z23 Encounter for immunization: Secondary | ICD-10-CM

## 2019-11-09 NOTE — Progress Notes (Signed)
   Covid-19 Vaccination Clinic  Name:  TIANN LAFONT    MRN: [id number] DOB: Dec 23, 1965  11/09/2019  Ms. Griffy was observed post Covid-19 immunization for 15 minutes without incident. She was provided with Vaccine Information Sheet and instruction to access the V-Safe system.   Ms. Harju was instructed to call 911 with any severe reactions post vaccine: Marland Kitchen Difficulty breathing  . Swelling of face and throat  . A fast heartbeat  . A bad rash all over body  . Dizziness and weakness   Immunizations Administered    Name Date Dose VIS Date Route   Moderna COVID-19 Vaccine 11/09/2019  1:09 PM 0.5 mL 07/06/2019 Intramuscular   Manufacturer: Moderna   Lot: [id number]   [id number]

## 2020-02-01 DIAGNOSIS — N182 Chronic kidney disease, stage 2 (mild): Secondary | ICD-10-CM | POA: Diagnosis not present

## 2020-02-01 DIAGNOSIS — E559 Vitamin D deficiency, unspecified: Secondary | ICD-10-CM | POA: Diagnosis not present

## 2020-02-01 DIAGNOSIS — E785 Hyperlipidemia, unspecified: Secondary | ICD-10-CM | POA: Diagnosis not present

## 2020-03-20 DIAGNOSIS — L239 Allergic contact dermatitis, unspecified cause: Secondary | ICD-10-CM | POA: Diagnosis not present

## 2020-03-20 DIAGNOSIS — L0889 Other specified local infections of the skin and subcutaneous tissue: Secondary | ICD-10-CM | POA: Diagnosis not present

## 2020-03-22 DIAGNOSIS — Z20828 Contact with and (suspected) exposure to other viral communicable diseases: Secondary | ICD-10-CM | POA: Diagnosis not present

## 2020-05-08 DIAGNOSIS — H109 Unspecified conjunctivitis: Secondary | ICD-10-CM | POA: Diagnosis not present

## 2020-05-08 DIAGNOSIS — J029 Acute pharyngitis, unspecified: Secondary | ICD-10-CM | POA: Diagnosis not present

## 2020-06-15 DIAGNOSIS — Z1231 Encounter for screening mammogram for malignant neoplasm of breast: Secondary | ICD-10-CM | POA: Diagnosis not present
